# Patient Record
Sex: Female | Born: 1965 | Race: White | Hispanic: No | Marital: Married | Smoking: Never smoker
Health system: Southern US, Community
[De-identification: ages and names within clinical notes are randomized; demographics above are authoritative.]

## PROBLEM LIST (undated history)

## (undated) DIAGNOSIS — L723 Sebaceous cyst: Secondary | ICD-10-CM

## (undated) DIAGNOSIS — K219 Gastro-esophageal reflux disease without esophagitis: Secondary | ICD-10-CM

## (undated) HISTORY — DX: Gastro-esophageal reflux disease without esophagitis: K21.9

## (undated) HISTORY — DX: Sebaceous cyst: L72.3

---

## 1996-06-28 HISTORY — PX: TUBAL LIGATION: SHX77

## 2005-06-28 HISTORY — PX: TONSILLECTOMY: SUR1361

## 2013-03-08 DIAGNOSIS — K219 Gastro-esophageal reflux disease without esophagitis: Secondary | ICD-10-CM | POA: Insufficient documentation

## 2015-05-20 ENCOUNTER — Ambulatory Visit (INDEPENDENT_AMBULATORY_CARE_PROVIDER_SITE_OTHER): Payer: BLUE CROSS/BLUE SHIELD

## 2015-05-20 ENCOUNTER — Ambulatory Visit
Admission: EM | Admit: 2015-05-20 | Discharge: 2015-05-20 | Disposition: A | Discharge status: Home / Self Care | Payer: BLUE CROSS/BLUE SHIELD | Attending: Family Medicine | Admitting: Family Medicine

## 2015-05-20 DIAGNOSIS — S93602A Unspecified sprain of left foot, initial encounter: Secondary | ICD-10-CM | POA: Diagnosis not present

## 2015-05-20 MED ORDER — MELOXICAM 15 MG PO TABS: 15.0000 mg | ORAL_TABLET | Freq: Every day | ORAL | Status: DC

## 2015-05-20 NOTE — Discharge Instructions (Signed)
Cryotherapy Cryotherapy is when you put ice on your injury. Ice helps lessen pain and puffiness (swelling) after an injury. Ice works the best when you start using it in the first 24 to 48 hours after an injury. HOME CARE  Put a dry or damp towel between the ice pack and your skin.  You may press gently on the ice pack.  Leave the ice on for no more than 10 to 20 minutes at a time.  Check your skin after 5 minutes to make sure your skin is okay.  Rest at least 20 minutes between ice pack uses.  Stop using ice when your skin loses feeling (numbness).  Do not use ice on someone who cannot tell you when it hurts. This includes small children and people with memory problems (dementia). GET HELP RIGHT AWAY IF:  You have white spots on your skin.  Your skin turns blue or pale.  Your skin feels waxy or hard.  Your puffiness gets worse. MAKE SURE YOU:   Understand these instructions.  Will watch your condition.  Will get help right away if you are not doing well or get worse.   This information is not intended to replace advice given to you by your health care provider. Make sure you discuss any questions you have with your health care provider.   Document Released: 12/01/2007 Document Revised: 09/06/2011 Document Reviewed: 02/04/2011 Elsevier Interactive Patient Education 2016 [place] Sprain A foot sprain is an injury to one of the strong bands of tissue (ligaments) that connect and support the many bones in your feet. The ligament can be stretched too much or it can tear. A tear can be either partial or complete. The severity of the sprain depends on how much of the ligament was damaged or torn. CAUSES A foot sprain is usually caused by suddenly twisting or pivoting your foot. RISK FACTORS This injury is more likely to occur in people who:  Play a sport, such as basketball or football.  Exercise or play a sport without warming up.  Start a new workout or  sport.  Suddenly increase how long or hard they exercise or play a sport. SYMPTOMS Symptoms of this condition start soon after an injury and include:  Pain, especially in the arch of the foot.  Bruising.  Swelling.  Inability to walk or use the foot to support body weight. DIAGNOSIS This condition is diagnosed with a medical history and physical exam. You may also have imaging tests, such as:  X-rays to make sure there are no broken bones (fractures).  MRI to see if the ligament has torn. TREATMENT Treatment varies depending on the severity of your sprain. Mild sprains can be treated with rest, ice, compression, and elevation (RICE). If your ligament is overstretched or partially torn, treatment usually involves keeping your foot in a fixed position (immobilization) for a period of time. To help you do this, your health care provider will apply a bandage, splint, or walking boot to keep your foot from moving until it heals. You may also be advised to use crutches or a scooter for a few weeks to avoid bearing weight on your foot while it is healing. If your ligament is fully torn, you may need surgery to reconnect the ligament to the bone. After surgery, a cast or splint will be applied and will need to stay on your foot while it heals. Your health care provider may also suggest exercises or physical therapy to strengthen  your foot. HOME CARE INSTRUCTIONS If You Have a Bandage, Splint, or Walking Boot:  Wear it as directed by your health care provider. Remove it only as directed by your health care provider.  Loosen the bandage, splint, or walking boot if your toes become numb and tingle, or if they turn cold and blue. Bathing  If your health care provider approves bathing and showering, cover the bandage or splint with a watertight plastic bag to protect it from water. Do not let the bandage or splint get wet. Managing Pain, Stiffness, and Swelling   If directed, apply ice to the  injured area:  Put ice in a plastic bag.  Place a towel between your skin and the bag.  Leave the ice on for 20 minutes, 2-3 times per day.  Move your toes often to avoid stiffness and to lessen swelling.  Raise (elevate) the injured area above the level of your heart while you are sitting or lying down. Driving  Do not drive or operate heavy machinery while taking pain medicine.  Do not drive while wearing a bandage, splint, or walking boot on a foot that you use for driving. Activity  Rest as directed by your health care provider.  Do not use the injured foot to support your body weight until your health care provider says that you can. Use crutches or other supportive devices as directed by your health care provider.  Ask your health care provider what activities are safe for you. Gradually increase how much and how far you walk until your health care provider says it is safe to return to full activity.  Do any exercise or physical therapy as directed by your health care provider. General Instructions  If a splint was applied, do not put pressure on any part of it until it is fully hardened. This may take several hours.  Take medicines only as directed by your health care provider. These include over-the-counter medicines and prescription medicines.  Keep all follow-up visits as directed by your health care provider. This is important.  When you can walk without pain, wear supportive shoes that have stiff soles. Do not wear flip-flops, and do not walk barefoot. SEEK MEDICAL CARE IF:  Your pain is not controlled with medicine.  Your bruising or swelling gets worse or does not get better with treatment.  Your splint or walking boot is damaged. SEEK IMMEDIATE MEDICAL CARE IF:  Your foot is numb or blue.  Your foot feels colder than normal.   This information is not intended to replace advice given to you by your health care provider. Make sure you discuss any questions  you have with your health care provider.   Document Released: 12/04/2001 Document Revised: 10/29/2014 Document Reviewed: 04/17/2014 Elsevier Interactive Patient Education [employer] Elsevier Inc[employer]

## 2015-05-20 NOTE — ED Provider Notes (Signed)
CSN: [id number]     Arrival date & time 05/20/15  1018 History   First MD Initiated Contact with Patient 05/20/15 1051    Nurses notes were reviewed. Chief Complaint  Patient presents with  . 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 That Better initially but by last night she started having more pain in the left foot over the instep of the left foot. No history of injury in that area before. (Consider location/radiation/quality/duration/timing/severity/associated sxs/prior Treatment) Patient is a 49 y.o. female presenting with lower extremity pain. The history is provided by the patient. No language interpreter was used.  Foot Pain This is a new problem. The current episode started yesterday. The problem occurs constantly. The problem has been gradually worsening. Pertinent negatives include no chest pain, no abdominal pain, no headaches and no shortness of breath. The symptoms are aggravated by walking. Nothing relieves the symptoms. She has tried nothing for the symptoms. The treatment provided no relief.    History reviewed. No pertinent past medical history. Past Surgical History  Procedure Laterality Date  . Cesarean section      1992 1998   Family History  Problem Relation Age of Onset  . Diabetes Mother   . Cancer Father    Social History  Substance Use Topics  . Smoking status: Never Smoker   . Smokeless tobacco: None  . Alcohol Use: Yes     Comment: socially   OB History    No data available     Review of Systems  Respiratory: Negative for shortness of breath.   Cardiovascular: Negative for chest pain.  Gastrointestinal: Negative for abdominal pain.  Musculoskeletal: Positive for myalgias, arthralgias and gait problem.  Neurological: Negative for headaches.  All other systems reviewed and are  negative.   Allergies  Review of patient's allergies indicates no known allergies.  Home Medications   Prior to Admission medications   Medication Sig Start Date End Date Taking? Authorizing Provider  meloxicam (MOBIC) 15 MG tablet Take 1 tablet (15 mg total) by mouth daily. 05/20/15   Hassan RowanEugene Consetta Cosner, MD   Meds Ordered and Administered this Visit  Medications - No data to display  BP 112/77 mmHg  Pulse 87  Temp(Src) 98.8 F (37.1 C) (Tympanic)  Resp 16  Ht 5\' 6"  (1.676 m)  Wt 150 lb (68.04 kg)  BMI 24.22 kg/m2  SpO2 98%  LMP 04/14/2015 No data found.   Physical Exam  Constitutional: She is oriented to person, place, and time. She appears well-developed and well-nourished.  HENT:  Head: Normocephalic and atraumatic.  Eyes: Conjunctivae are normal. Pupils are equal, round, and reactive to light.  Musculoskeletal: She exhibits tenderness.       Left foot: There is tenderness, bony tenderness and swelling. There is normal range of motion.       Feet:  Neurological: She is alert and oriented to person, place, and time.  Skin: Skin is warm. There is erythema.  Psychiatric: She has a normal mood and affect.  Vitals reviewed.  Should be noted that most the pain and tenderness is over the between the tarsal and metatarsal bones and joint about the second and third metatarsal area. Patient's good range of motion of foot pulses are intact as well. ED Course  Procedures (including critical care time)  Labs Review Labs Reviewed - No data to display  Imaging Review Dg Foot Complete Left  05/20/2015  CLINICAL DATA:  Turned foot inward yesterday with pain over the dorsum EXAM: LEFT FOOT - COMPLETE 3+ VIEW COMPARISON:  None. FINDINGS: Tarsal-metatarsal alignment is normal. Joint spaces appear normal. No fracture is seen. IMPRESSION: Negative. Electronically Signed   By: Dwyane Dee M.D.   On: 05/20/2015 11:26     Visual Acuity Review  Right Eye Distance:   Left Eye Distance:     Bilateral Distance:    Right Eye Near:   Left Eye Near:    Bilateral Near:         MDM   1. Foot sprain, left, initial encounter    We'll place patient on Mobic 15 mg 1 tablet day she states that she is off aggressive of present holiday since did not need a work note. We talked that the place where she is painful between the metatarsal tarsal bone is be difficult to provide a type of support or brace. Both times it is like Monday to be doing a lot better. If not returned follow-up either here or PCP in about 2 weeks for reevaluation. Patient was notified x-ray was negative.      Hassan Rowan, MD 05/20/15 1154

## 2015-05-20 NOTE — ED Notes (Signed)
States rolled left foot yesterday. C/o ?2nd metatarsal pain when weight bearing

## 2015-08-06 ENCOUNTER — Encounter: Payer: Self-pay | Admitting: Family Medicine

## 2015-08-06 ENCOUNTER — Ambulatory Visit (INDEPENDENT_AMBULATORY_CARE_PROVIDER_SITE_OTHER): Payer: BLUE CROSS/BLUE SHIELD | Admitting: Family Medicine

## 2015-08-06 VITALS — BP 112/68 | HR 104 | Ht 66.0 in | Wt 150.8 lb

## 2015-08-06 DIAGNOSIS — Z1239 Encounter for other screening for malignant neoplasm of breast: Secondary | ICD-10-CM

## 2015-08-06 DIAGNOSIS — N6082 Other benign mammary dysplasias of left breast: Secondary | ICD-10-CM | POA: Diagnosis not present

## 2015-08-06 DIAGNOSIS — N6002 Solitary cyst of left breast: Secondary | ICD-10-CM | POA: Insufficient documentation

## 2015-08-06 DIAGNOSIS — Z833 Family history of diabetes mellitus: Secondary | ICD-10-CM | POA: Insufficient documentation

## 2015-08-06 DIAGNOSIS — Z818 Family history of other mental and behavioral disorders: Secondary | ICD-10-CM | POA: Diagnosis not present

## 2015-08-06 DIAGNOSIS — L723 Sebaceous cyst: Secondary | ICD-10-CM | POA: Diagnosis not present

## 2015-08-06 DIAGNOSIS — Z87440 Personal history of urinary (tract) infections: Secondary | ICD-10-CM

## 2015-08-06 DIAGNOSIS — L089 Local infection of the skin and subcutaneous tissue, unspecified: Secondary | ICD-10-CM

## 2015-08-06 MED ORDER — CEPHALEXIN 500 MG PO CAPS: 500.0000 mg | ORAL_CAPSULE | Freq: Two times a day (BID) | ORAL | Status: DC

## 2015-08-06 NOTE — Progress Notes (Signed)
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 Has infected cyst below L breast, required I+D and abxs 3-4 yrs ago but none since, now with pain, redness past 3d, does annoy chronically and would like to have removed. No regular meds, uses Naprosyn prn only. Father died prostate ca age 61, mother with DM and bipolar d/o, one sister suicide age 62 and another sister with bipolar d/o and Meniere dz. Recurrent UTI's in past but none recently. Had negative stress test in 2014. Occ GERD treated with occ antacids only. Hx frozen R shoulder and L hip tendonitis both resolved with PT. Tetanus UTD (unsure year), last pelvic and mammo 3 yrs ago, no colonoscopy. Hx anemia resolved.  Review of Systems:  Review of Systems  Constitutional: Negative for fever and fatigue.  HENT: Negative for ear pain and sore throat.   Eyes: Negative for pain.  Respiratory: Negative for shortness of breath.   Cardiovascular: Negative for chest pain and leg swelling.  Gastrointestinal: Negative for abdominal pain.  Endocrine: Negative for polyuria.  Genitourinary: Negative for difficulty urinating.  Neurological: Negative for syncope and light-headedness.    Patient Active Problem List   Diagnosis Date Noted  . Cyst of breast, left, solitary 08/06/2015  . Infected sebaceous cyst 08/06/2015  . FH: bipolar disorder 08/06/2015  . Acid reflux 03/08/2013    Prior to Admission medications   Medication Sig Start Date End Date Taking? Authorizing Provider  cephALEXin (KEFLEX) 500 MG capsule Take 1 capsule (500 mg total) by mouth 2 (two) times daily. 08/06/15   Schuyler Amor, MD    No Known Allergies  Past Surgical History  Procedure Laterality Date  . Cesarean section  [id number], 1998  . Tonsillectomy   2007    Social History  Substance Use Topics  . Smoking status: Never Smoker   . Smokeless tobacco: None  . Alcohol Use: 0.0 oz/week    0 Standard drinks or equivalent per week     Comment: socially    Family History  Problem Relation Age of Onset  . Diabetes Mother   . Cancer Father     Medication list has been reviewed and updated.  Physical Examination: BP 112/68 mmHg  Pulse 104  Ht  (1.676 m)  Wt 150 lb 12.8 oz (68.402 kg)  BMI 24.35 kg/m2  LMP 07/07/2015  Physical Exam  Constitutional: She is oriented to person, place, and time. She appears well-developed and well-nourished.  HENT:  Right Ear: External ear normal.  Left Ear: External ear normal.  Nose: Nose normal.  Mouth/Throat: Oropharynx is clear and moist.  TM's clear  Eyes: Conjunctivae and EOM are normal. Pupils are equal, round, and reactive to light. No scleral icterus.  Neck: Neck supple. No thyromegaly present.  Cardiovascular: Normal rate, regular rhythm and normal heart sounds.   Pulmonary/Chest: Effort normal and breath sounds normal.  Abdominal: Soft. She exhibits no distension and no mass. There is no tenderness.  Musculoskeletal: She exhibits no edema.  Lymphadenopathy:    She has no cervical adenopathy.  Neurological: She is alert and oriented to person, place, and time.  Skin: Skin is warm and dry. She is not diaphoretic.  2.5 x 1 cm cyst inferior to L  breast with erythema, warmth, tenderness, and induration; overlying skin thin  Psychiatric: She has a normal mood and affect. Her behavior is normal.  Nursing note and vitals reviewed.   Assessment and Plan:  1. Infected sebaceous cyst Keflex x 10d, warm compresses, call if no spontaneous expression - Comprehensive Metabolic Panel (CMET) - CBC - TSH - Lipid Profile - Vitamin D (25 hydroxy) - Ambulatory referral to General Surgery  2. Cyst, breast, sebaceous, left Refer GS for removal given chronic and recurrent sxs  3. Hx of  recurrent urinary tract infection  4. FH: bipolar disorder  5. FH: diabetes  6. Breast cancer screening Advised f/u with local GYN for Pap/pelvic/mammo  Return in about 4 weeks (around 09/03/2015).  Dionne Ano. Kingsley Spittle MD [facility]  08/06/2015

## 2015-08-07 LAB — LIPID PANEL
CHOLESTEROL TOTAL: 163 mg/dL (ref 100–199)
Chol/HDL Ratio: 3.2 ratio units (ref 0.0–4.4)
HDL: 51 mg/dL (ref >39)
LDL Calculated: 93 mg/dL (ref 0–99)
Triglycerides: 93 mg/dL (ref 0–149)
VLDL CHOLESTEROL CAL: 19 mg/dL (ref 5–40)

## 2015-08-07 LAB — CBC
HEMOGLOBIN: 13.4 g/dL (ref 11.1–15.9)
Hematocrit: 40.7 % (ref 34.0–46.6)
MCH: 30 pg (ref 26.6–33.0)
MCHC: 32.9 g/dL (ref 31.5–35.7)
MCV: 91 fL (ref 79–97)
Platelets: 284 10*3/uL (ref 150–379)
RBC: 4.46 x10E6/uL (ref 3.77–5.28)
RDW: 14.3 % (ref 12.3–15.4)
WBC: 11.1 10*3/uL — ABNORMAL HIGH (ref 3.4–10.8)

## 2015-08-07 LAB — COMPREHENSIVE METABOLIC PANEL
A/G RATIO: 2 (ref 1.1–2.5)
ALT: 12 IU/L (ref 0–32)
AST: 13 IU/L (ref 0–40)
Albumin: 4.5 g/dL (ref 3.5–5.5)
Alkaline Phosphatase: 57 IU/L (ref 39–117)
BUN/Creatinine Ratio: 13 (ref 9–23)
BUN: 9 mg/dL (ref 6–24)
Bilirubin Total: 0.2 mg/dL (ref 0.0–1.2)
CALCIUM: 9.4 mg/dL (ref 8.7–10.2)
CO2: 24 mmol/L (ref 18–29)
CREATININE: 0.67 mg/dL (ref 0.57–1.00)
Chloride: 101 mmol/L (ref 96–106)
GFR calc Af Amer: 119 mL/min/{1.73_m2} (ref >59)
GFR, EST NON AFRICAN AMERICAN: 104 mL/min/{1.73_m2} (ref >59)
GLUCOSE: 129 mg/dL — AB (ref 65–99)
Globulin, Total: 2.3 g/dL (ref 1.5–4.5)
POTASSIUM: 4.1 mmol/L (ref 3.5–5.2)
Sodium: 143 mmol/L (ref 134–144)
Total Protein: 6.8 g/dL (ref 6.0–8.5)

## 2015-08-07 LAB — TSH: TSH: 1.52 u[IU]/mL (ref 0.450–4.500)

## 2015-08-07 LAB — VITAMIN D 25 HYDROXY (VIT D DEFICIENCY, FRACTURES): VIT D 25 HYDROXY: 21 ng/mL — AB (ref 30.0–100.0)

## 2015-08-08 ENCOUNTER — Encounter: Payer: Self-pay | Admitting: Surgery

## 2015-08-08 ENCOUNTER — Other Ambulatory Visit: Payer: Self-pay | Admitting: Family Medicine

## 2015-08-08 ENCOUNTER — Ambulatory Visit (INDEPENDENT_AMBULATORY_CARE_PROVIDER_SITE_OTHER): Payer: BLUE CROSS/BLUE SHIELD | Admitting: Surgery

## 2015-08-08 ENCOUNTER — Encounter: Payer: Self-pay | Admitting: Family Medicine

## 2015-08-08 VITALS — BP 136/91 | HR 99 | Temp 98.3°F | Ht 66.0 in | Wt 151.0 lb

## 2015-08-08 DIAGNOSIS — E559 Vitamin D deficiency, unspecified: Secondary | ICD-10-CM | POA: Insufficient documentation

## 2015-08-08 DIAGNOSIS — L723 Sebaceous cyst: Secondary | ICD-10-CM

## 2015-08-08 DIAGNOSIS — L089 Local infection of the skin and subcutaneous tissue, unspecified: Secondary | ICD-10-CM

## 2015-08-08 MED ORDER — VITAMIN D 50 MCG (2000 UT) PO CAPS: 1.0000 | ORAL_CAPSULE | Freq: Every day | ORAL | Status: AC

## 2015-08-08 NOTE — Progress Notes (Signed)
Added an A1C on Friday 08/08/15

## 2015-08-08 NOTE — Progress Notes (Signed)
Subjective:     Patient ID: Beverly Andersen, female   DOB: 17-Sep-1965, 50 y.o.   MRN: [id number]  HPI  50 year old female with a left chest infected sebaceous cyst. Patient states that she's had this for at least 20 years. Patient states a few years ago it did get inflamed and was aspirated and started draining. Patient states that about a week ago it became red and started hurting as well. Patient was seen by her primary care physician tarted on Keflex. The area has been draining on its own and is less red according to the patient. Patient denies any intense pain but says there is a dull pain in the area. She denies having in contact with MRSA and has not had any other cyst like this. She denies fever, chills, nausea, vomiting, abdominal pain, constipation, diarrhea, dysuria, or hematuria.   Past Medical History  Diagnosis Date  . GERD (gastroesophageal reflux disease)   . Sebaceous cyst     Under Left breast   Past Surgical History  Procedure Laterality Date  . Tonsillectomy  2007  . Cesarean section  [id number], 1998  . Tubal ligation  1998   Family History  Problem Relation Age of Onset  . Diabetes Mother   . Cancer Father    Social History   Social History  . Marital Status: Married    Spouse Name: N/A  . Number of Children: N/A  . Years of Education: N/A   Social History Main Topics  . Smoking status: Never Smoker   . Smokeless tobacco: Never Used  . Alcohol Use: 0.0 oz/week    0 Standard drinks or equivalent per week     Comment: 2 glasses wine/ weekly  . Drug Use: No  . Sexual Activity: Not Asked   Other Topics Concern  . None   Social History Narrative    Current outpatient prescriptions:  .  cephALEXin (KEFLEX) 500 MG capsule, Take 1 capsule (500 mg total) by mouth 2 (two) times daily., Disp: 20 capsule, Rfl: 0 No Known Allergies    Review of Systems  Constitutional: Negative for fever, chills, activity change, appetite change and fatigue.  HENT:  Negative for congestion and sore throat.   Respiratory: Negative for cough, wheezing and stridor.   Cardiovascular: Negative for chest pain, palpitations and leg swelling.  Gastrointestinal: Negative for abdominal pain, diarrhea, constipation and abdominal distention.  Genitourinary: Negative for dysuria and hematuria.  Musculoskeletal: Negative for back pain.  Skin: Positive for color change and wound. Negative for pallor and rash.  Neurological: Negative for dizziness and weakness.  Hematological: Negative for adenopathy. Does not bruise/bleed easily.  Psychiatric/Behavioral: Negative for agitation. The patient is not nervous/anxious.   All other systems reviewed and are negative.      Objective:   Physical Exam  Constitutional: She is oriented to person, place, and time. She appears well-developed and well-nourished. No distress.  HENT:  Head: Normocephalic and atraumatic.  Nose: Nose normal.  Mouth/Throat: Oropharynx is clear and moist. No oropharyngeal exudate.  Eyes: Conjunctivae and EOM are normal. Pupils are equal, round, and reactive to light. No scleral icterus.  Neck: Normal range of motion. Neck supple. No tracheal deviation present.  Cardiovascular: Normal rate, regular rhythm, normal heart sounds and intact distal pulses.  Exam reveals no gallop and no friction rub.   No murmur heard. Pulmonary/Chest: Effort normal and breath sounds normal. No respiratory distress. She has no wheezes.  Abdominal: Soft. Bowel sounds are normal. She exhibits no distension.  There is no tenderness.  Musculoskeletal: Normal range of motion. She exhibits no edema or tenderness.  Neurological: She is alert and oriented to person, place, and time. No cranial nerve deficit.  Skin: Skin is warm. There is erythema.  Left inframammary fold 2cm sebaceous cyst draining purulent material with erythema surrounding.  A fair amount of purulent material was expressed with gentle pressure    Psychiatric: She  has a normal mood and affect. Her behavior is normal. Judgment and thought content normal.  Vitals reviewed.      Assessment:     Left inframammary fold sebaceous cyst    Plan:     Discussed with the patient is easier to remove the entire capsule of these when the area is not infected. Given that there is an opening in its draining, have her use warm compresses to continue helping the area drain and to finish out her Keflex. Will have her call if she feels like this area is not getting better for refill of her antibiotic. We'll have her return in 2 weeks for excision at that time once the inflammation has resolved.  Patient was given opportunity to ask questions and have them answered. Patient is agreeable to plan.

## 2015-08-08 NOTE — Progress Notes (Signed)
Called pt- her cyst is draining- Dr @ [facility] is aware and is going to see her Monday- She just had a coke prior to her labs so she thinks that is the reason her Glucose was elevated, but I informed her that we would be letting her know the results from A1C that you added.

## 2015-08-08 NOTE — Patient Instructions (Signed)
Continue your antibiotics. If you feel like this is still inflamed after finishing this antibiotics, please let us know so that we may order a refill.  Begin hot compresses at least twice daily. This will help this area continue to drain.  We will arrange an excision of this area on 08/20/15 at the [facility] office. Please see appointment below.  If you have any questions or concerns, please call our office.

## 2015-08-09 LAB — SPECIMEN STATUS REPORT

## 2015-08-09 LAB — HGB A1C W/O EAG: HEMOGLOBIN A1C: 5.8 % — AB (ref 4.8–5.6)

## 2015-08-20 ENCOUNTER — Encounter: Payer: Self-pay | Admitting: Surgery

## 2015-08-20 ENCOUNTER — Ambulatory Visit (INDEPENDENT_AMBULATORY_CARE_PROVIDER_SITE_OTHER): Payer: BLUE CROSS/BLUE SHIELD | Admitting: Surgery

## 2015-08-20 VITALS — BP 129/78 | HR 90 | Temp 97.7°F | Wt 150.0 lb

## 2015-08-20 DIAGNOSIS — L089 Local infection of the skin and subcutaneous tissue, unspecified: Secondary | ICD-10-CM

## 2015-08-20 DIAGNOSIS — L723 Sebaceous cyst: Secondary | ICD-10-CM | POA: Diagnosis not present

## 2015-08-20 NOTE — Telephone Encounter (Signed)
error 

## 2015-08-20 NOTE — Progress Notes (Signed)
50 year old female with a left breast infected sebaceous cyst. Patient was seen a couple weeks ago and was given antibiotics to help improve the area. Patient states that she has not had any fever or chills patient states that the redness has resolved. Patient did state that she had some yellowish thick drainage that came out about a week ago but has not had any other drainage since that time.  Filed Vitals:   08/20/15 1014  BP: 129/78  Pulse: 90  Temp: 97.7 F (36.5 C)   PE:  Gen: NAD Cardio: RRR, no murmur or gallop Res: CTAB/L, no wheezes Abd: soft, nt ,nd Left breast: 2cm lesion in inferiormammory fold, no erythema or drainage Ext: 2+ pulses, no edema  A/P:   50 year old with sebaceous cyst of the left breast in the inferior mammary fold. Patient's infection has cleared up with antibiotics and filled that we could excise this lesion today. Discussed the risk benefits alternatives and possible outcomes with the patient including bleeding infection and return of the sebaceous cyst. Patient was given opportunity to ask questions and have them answered. Patient is agreeable with plan and informed consent was obtained.

## 2015-08-20 NOTE — Progress Notes (Signed)
Sebaceous Cyst Excision Procedure Note  Pre-operative Diagnosis: Sebaceous cyst 2cm in size  Post-operative Diagnosis: same  Locations: Left breast inferiormammory fold  Indications: Sebaceous cyst that had been infected in the inferior mammory fold of left breast.  Anesthesia: Lidocaine 1% with epinephrine without added sodium bicarbonate  Procedure Details  History of allergy to iodine: no  Patient informed of the risks (including bleeding and infection) and benefits of the  procedure and Written informed consent obtained.  The lesion and surrounding area was given a sterile prep using betadyne and draped in the usual sterile fashion. An incision was made over the cyst, which was dissected free of the surrounding tissue and removed.  The cyst was filled with typical sebaceous material.  The wound was closed with 3-0 Vicryl using simple interrupted stitches to close the subcutaneous tissue and 4-0 Monocryl was used in running subcuticular stitch to close the skin.  Sterile glue was applied to the area.  The specimen was not sent for pathologic examination. The patient tolerated the procedure well.  EBL: 5 ml  Findings: Sebaceous material in cyst excised from skin  Condition: Stable  Complications: none.  Plan: 1. Instructed to keep the wound dry and covered for 24hrs and then could shower afterward 2. Warning signs of infection were reviewed.   3. Recommended that the patient use Naproxen, NSAID, OTC acetaminophen and OTC ibuprofen as needed for pain.  4. Return for wound check in 2 weeks.

## 2015-08-20 NOTE — Patient Instructions (Signed)
In about two weeks your incision should be healed. Please leave the glue on your skin, do not remove. This will eventually fall off on its own.  Please give [place] a call you notice any redness, fever and/or drainage from your incision.  You are able to shower after 24 hours after. Try not to scrub the incision area.  It is okay to take Naproxen, Ibuprofen or Tylenol for the pain.  We will see you in two weeks to check on how you are doing.

## 2015-09-03 ENCOUNTER — Ambulatory Visit: Payer: BLUE CROSS/BLUE SHIELD | Admitting: Family Medicine

## 2015-09-05 ENCOUNTER — Ambulatory Visit: Payer: Self-pay | Admitting: Surgery

## 2015-09-08 ENCOUNTER — Other Ambulatory Visit: Payer: Self-pay | Admitting: Family Medicine

## 2015-09-08 ENCOUNTER — Ambulatory Visit (INDEPENDENT_AMBULATORY_CARE_PROVIDER_SITE_OTHER): Payer: BLUE CROSS/BLUE SHIELD | Admitting: Surgery

## 2015-09-08 ENCOUNTER — Encounter: Payer: Self-pay | Admitting: Surgery

## 2015-09-08 ENCOUNTER — Telehealth: Payer: Self-pay | Admitting: Family Medicine

## 2015-09-08 VITALS — BP 136/91 | HR 92 | Temp 98.1°F | Wt 150.0 lb

## 2015-09-08 DIAGNOSIS — Z09 Encounter for follow-up examination after completed treatment for conditions other than malignant neoplasm: Secondary | ICD-10-CM

## 2015-09-08 DIAGNOSIS — Z1239 Encounter for other screening for malignant neoplasm of breast: Secondary | ICD-10-CM

## 2015-09-08 NOTE — Telephone Encounter (Signed)
Inform patient mammogram ordered

## 2015-09-08 NOTE — Progress Notes (Signed)
Catheter is following up after excision of a left epidermal inclusion cyst on the inframammary faulted by Dr. Orvis BrillLoflin. No complaints today. Last mammogram was 3 years ago. Discussed with her about this importance anteriorly option of medial ordering versus going through her primary care doctor. She stated that she will go to her primary care doctor and get the screening mammogram  Physical exam: No acute distress incision healing well no evidence of infection I was able to pull small Vicryl knot without any issues.  A/P doing well once again reiterated about the importance of screening mammogram. No complications. RTC when necessary.

## 2015-09-08 NOTE — Patient Instructions (Signed)
Please call your primary care doctor to schedule your mammogram.

## 2015-09-10 NOTE — Telephone Encounter (Signed)
Spoke with patient. Patient advised.  Will call back with any future questions or concerns. [facility]

## 2015-09-12 ENCOUNTER — Ambulatory Visit
Admission: RE | Admit: 2015-09-12 | Discharge: 2015-09-12 | Disposition: A | Discharge status: Home / Self Care | Payer: BLUE CROSS/BLUE SHIELD | Source: Ambulatory Visit | Attending: Family Medicine | Admitting: Family Medicine

## 2015-09-12 DIAGNOSIS — Z1231 Encounter for screening mammogram for malignant neoplasm of breast: Secondary | ICD-10-CM | POA: Diagnosis not present

## 2015-09-12 DIAGNOSIS — Z1239 Encounter for other screening for malignant neoplasm of breast: Secondary | ICD-10-CM

## 2015-09-24 ENCOUNTER — Other Ambulatory Visit: Payer: Self-pay | Admitting: Family Medicine

## 2015-09-24 DIAGNOSIS — R928 Other abnormal and inconclusive findings on diagnostic imaging of breast: Secondary | ICD-10-CM

## 2015-10-09 ENCOUNTER — Other Ambulatory Visit: Payer: BLUE CROSS/BLUE SHIELD

## 2015-10-09 ENCOUNTER — Ambulatory Visit: Payer: BLUE CROSS/BLUE SHIELD

## 2015-10-10 ENCOUNTER — Ambulatory Visit
Admission: RE | Admit: 2015-10-10 | Discharge: 2015-10-10 | Disposition: A | Discharge status: Home / Self Care | Payer: BLUE CROSS/BLUE SHIELD | Source: Ambulatory Visit | Attending: Family Medicine | Admitting: Family Medicine

## 2015-10-10 DIAGNOSIS — N6489 Other specified disorders of breast: Secondary | ICD-10-CM | POA: Insufficient documentation

## 2015-10-10 DIAGNOSIS — R928 Other abnormal and inconclusive findings on diagnostic imaging of breast: Secondary | ICD-10-CM | POA: Insufficient documentation

## 2016-11-29 IMAGING — CR DG FOOT COMPLETE 3+V*L*
4 series · 4 of 4 positions shown · non-contrast
Comparison: None.

CLINICAL DATA: Turned foot inward yesterday with pain over the
dorsum

EXAM:
LEFT FOOT - COMPLETE 3+ VIEW

[foot ap]
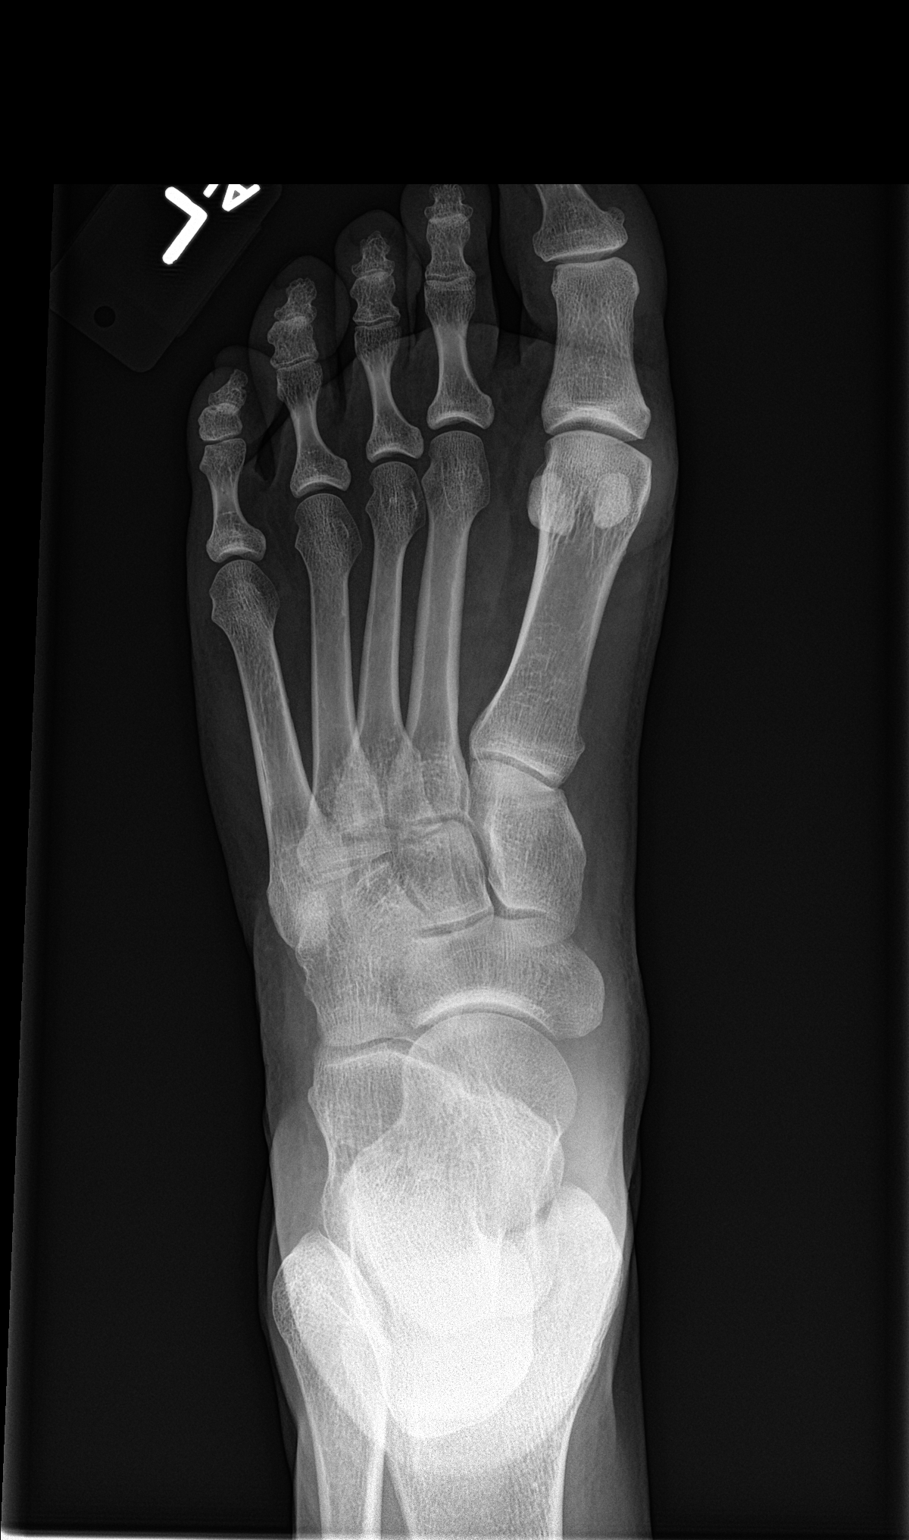

[foot obl]
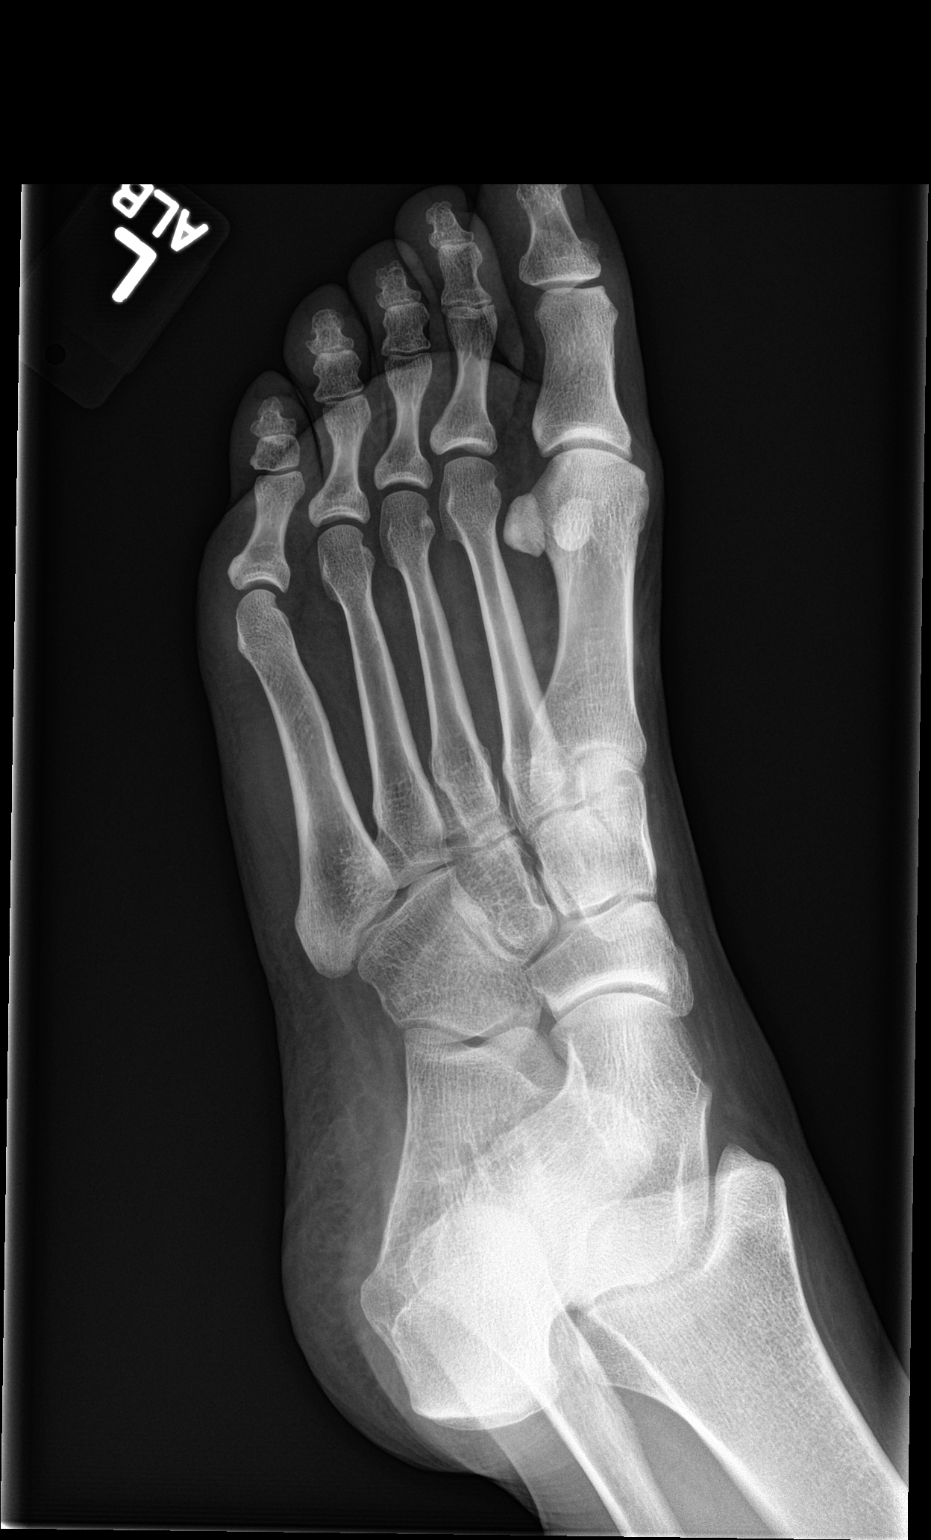

[foot lat (1 of 2)]
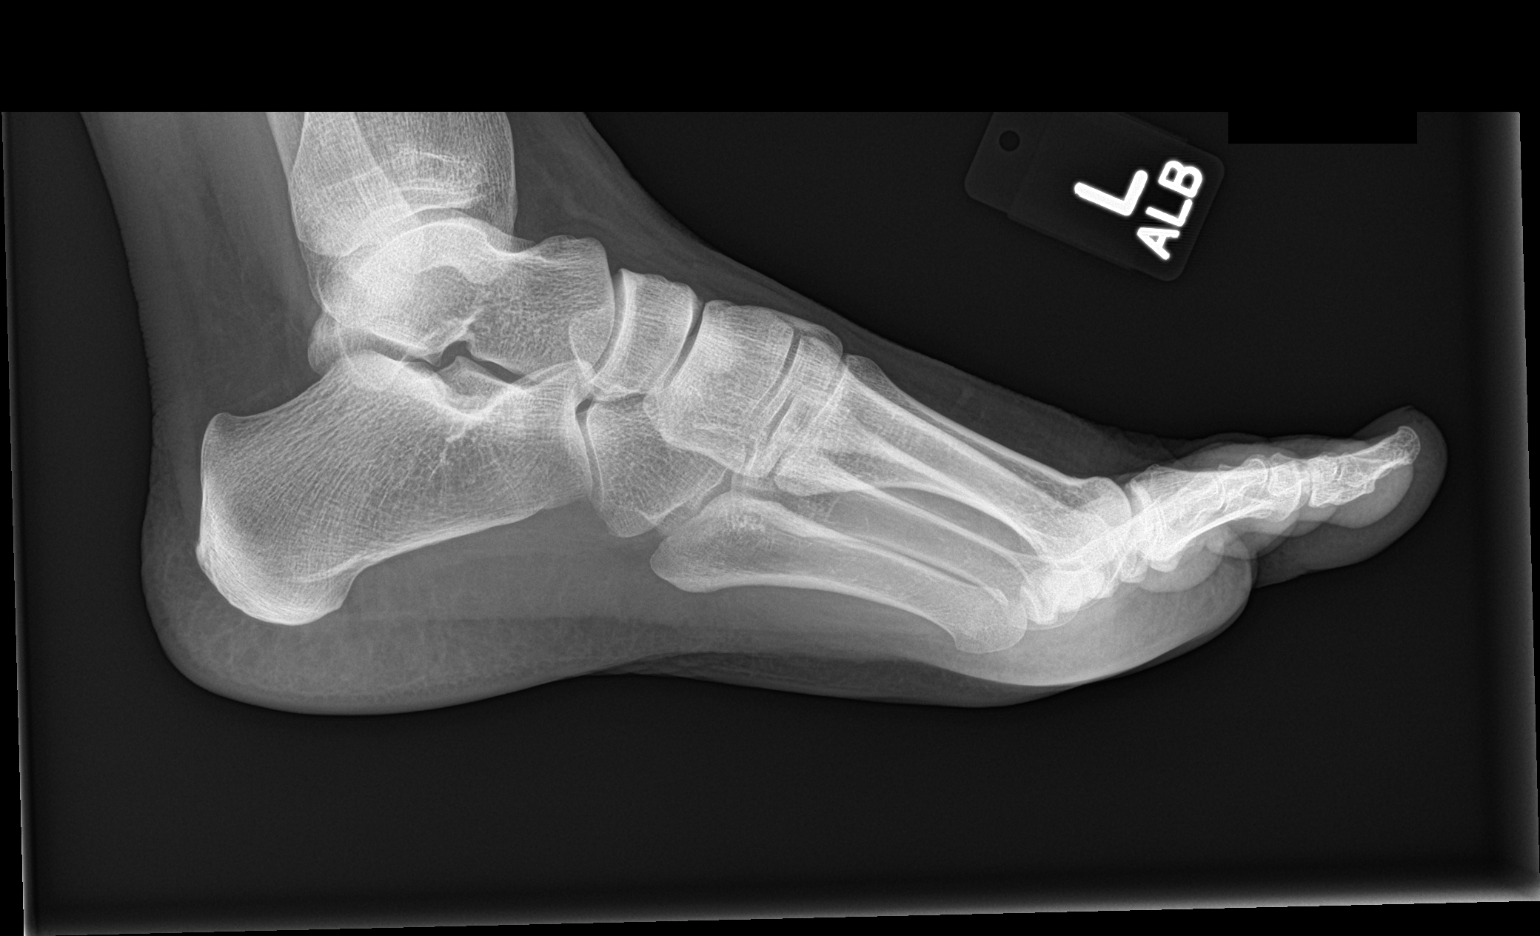

[foot lat (2 of 2)]
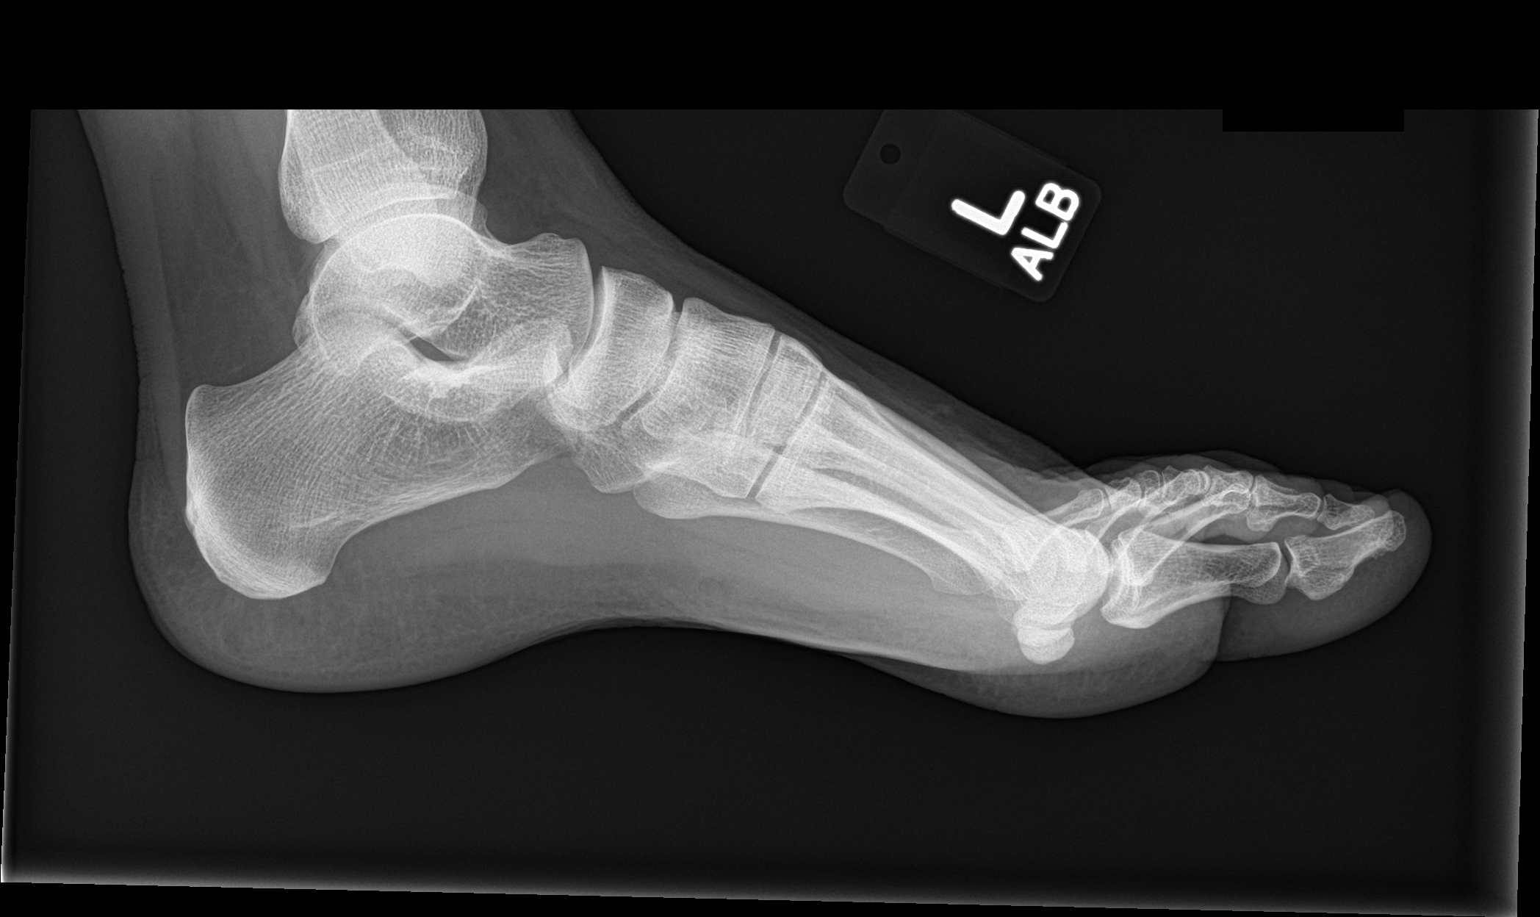

[4 of 4 positions shown; findings below may reference images not displayed]

FINDINGS: Tarsal-metatarsal alignment is normal. Joint spaces appear normal.
No fracture is seen.
IMPRESSION: Negative.

## 2016-12-27 IMAGING — US US BREAST*L* LIMITED INC AXILLA
1 series · 13 of 13 positions shown · non-contrast
Comparison: Previous exam(s).

CLINICAL DATA: 49-year-old female presenting for screening recall
of a left breast asymmetry.

EXAM:
2D DIGITAL DIAGNOSTIC UNILATERAL LEFT MAMMOGRAM WITH CAD AND ADJUNCT
TOMO
LEFT BREAST ULTRASOUND

[Series 1: us breast*left* limited inc axilla · 0.05mm/px · 13 of 13 slices shown]
[im 1/13]
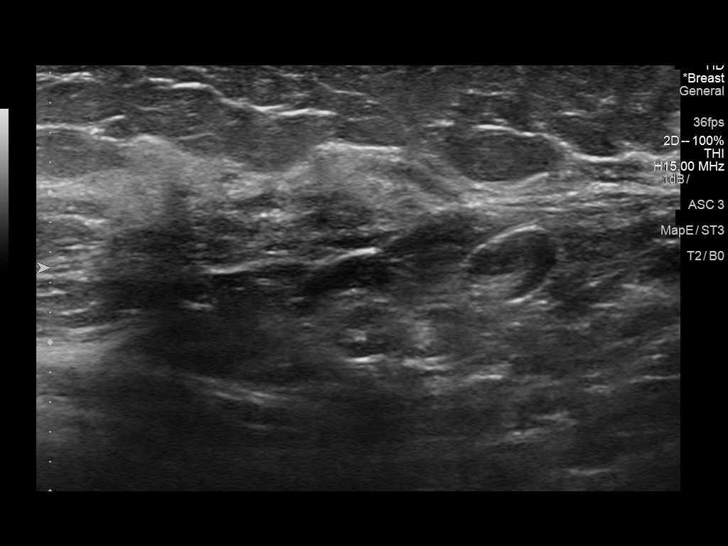
[im 2/13]
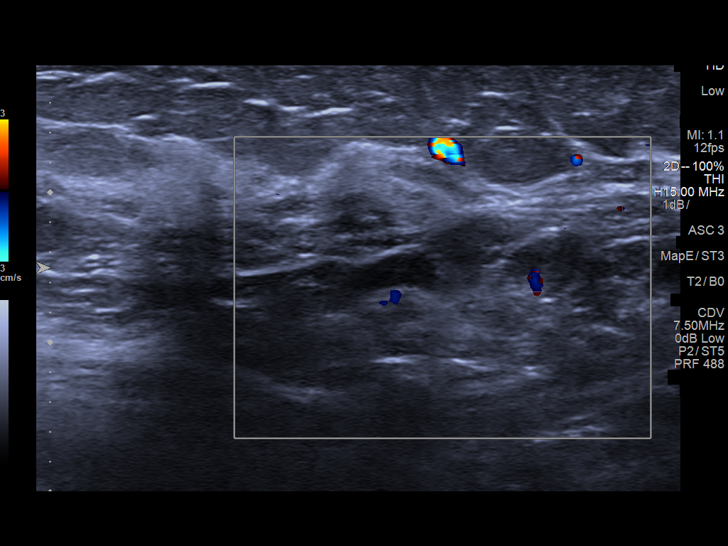
[im 3/13]
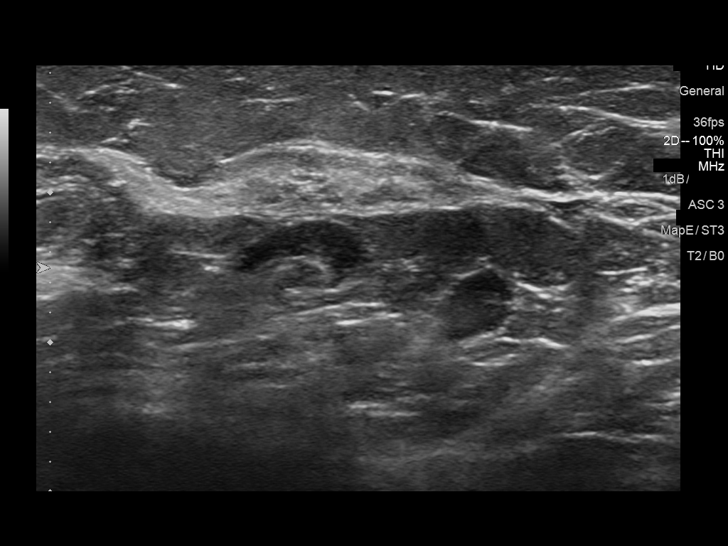
[im 4/13]
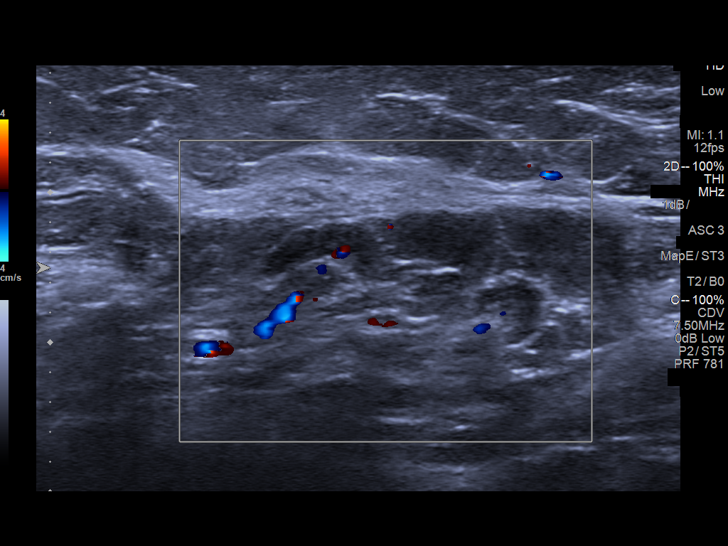
[im 5/13]
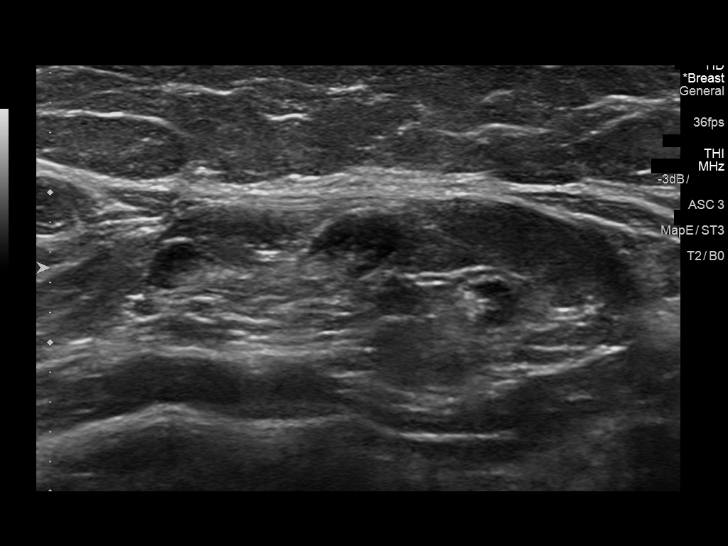
[im 6/13]
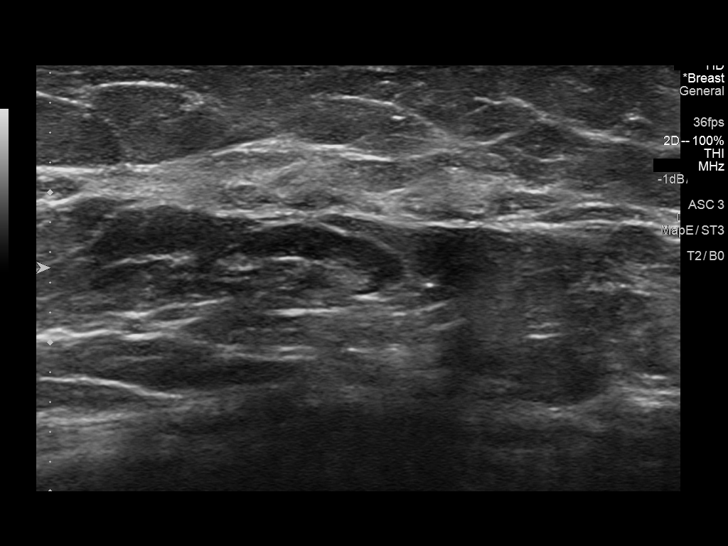
[im 7/13]
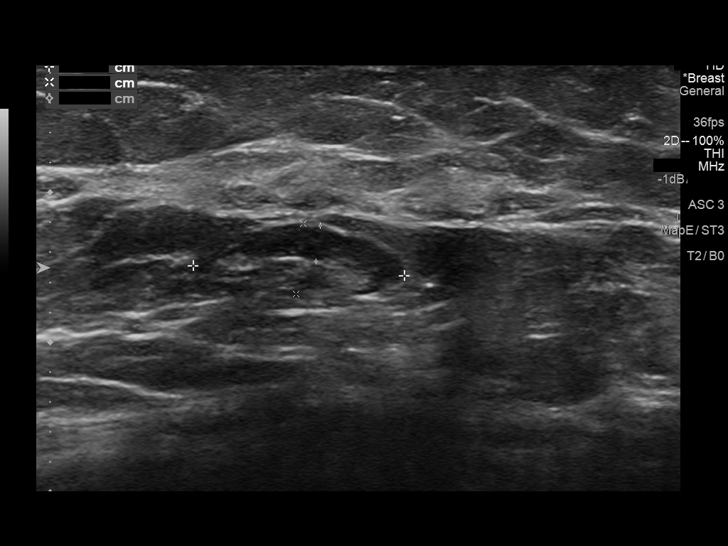
[im 8/13]
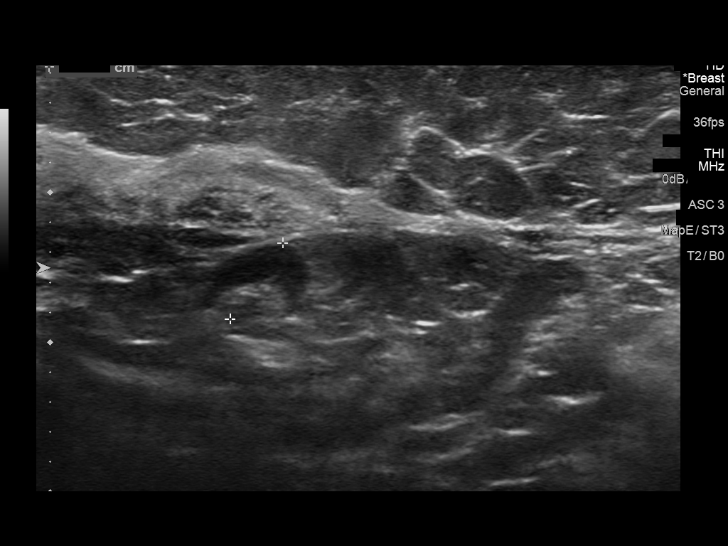
[im 9/13]
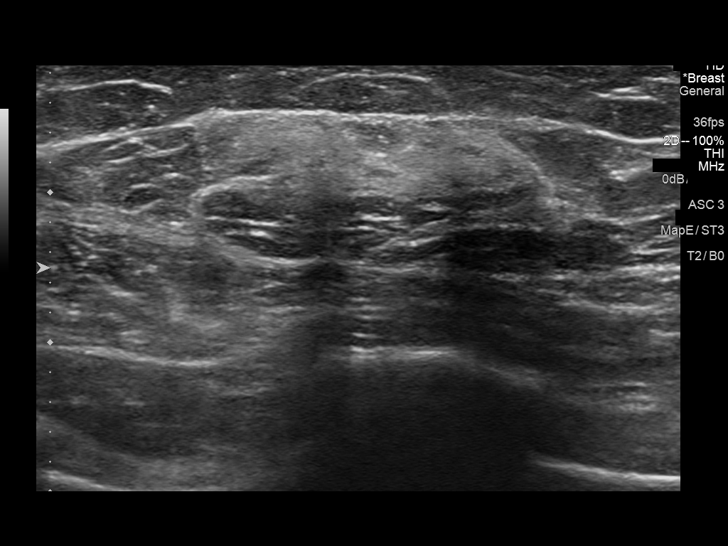
[im 10/13]
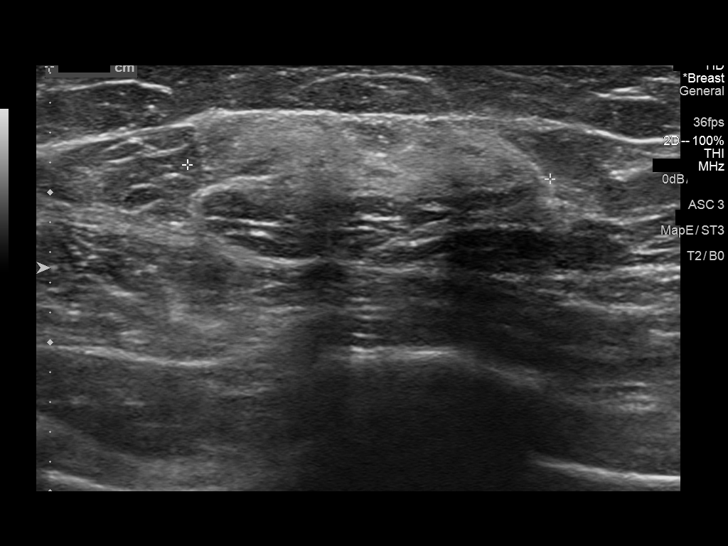
[im 11/13]
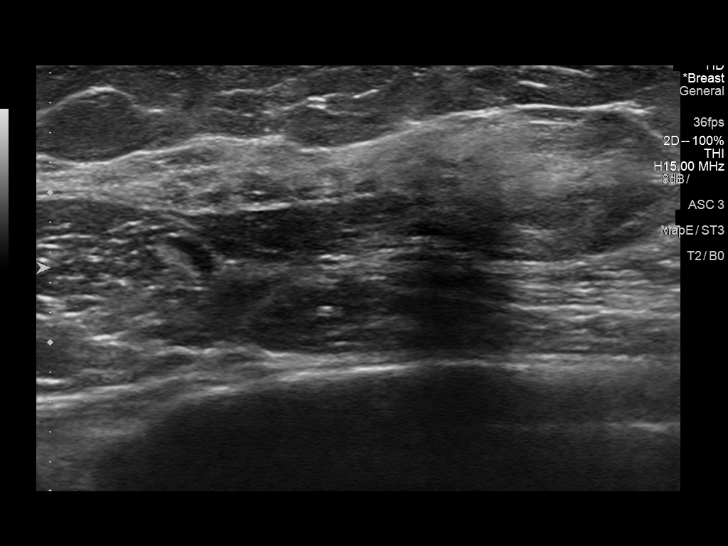
[im 12/13]
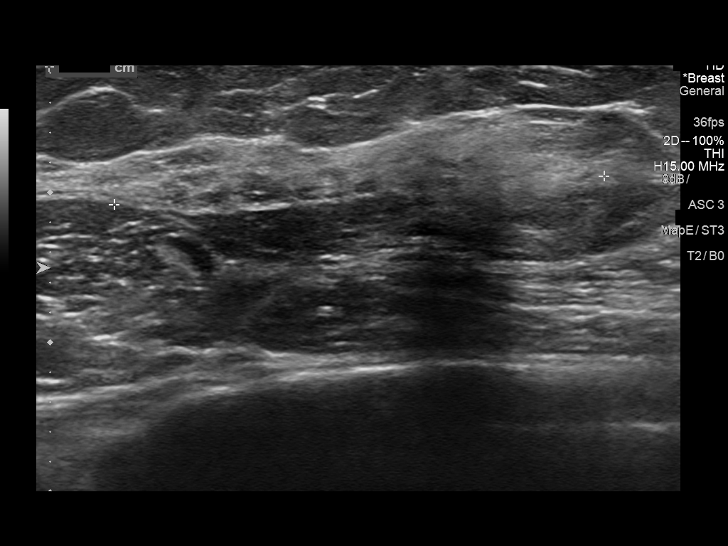
[im 13/13]
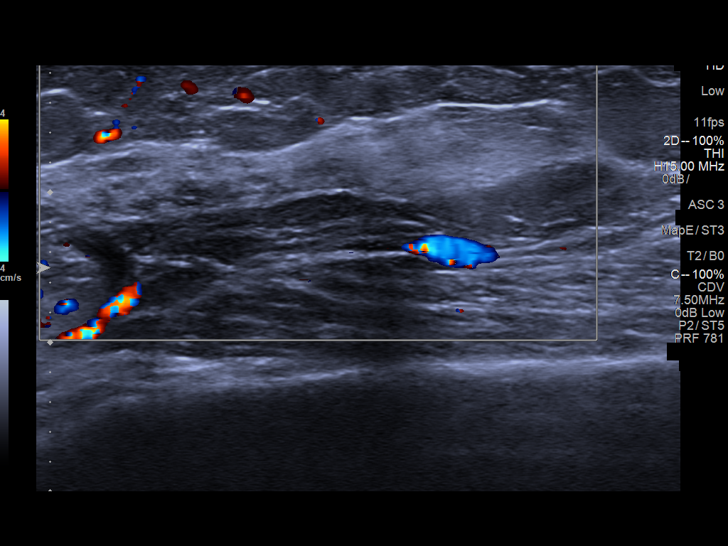

[13 of 13 positions shown; findings below may reference images not displayed]

ACR Breast Density Category c: The breast tissue is heterogeneously
dense, which may obscure small masses.
FINDINGS: On the MLO view, the additional diagnostic images of the superior
left breast demonstrate a cluster of lymph nodes, however the
prominent asymmetry seen on the screening mammogram is less
prominent. On the spot compression tomosynthesis images, the
asymmetry is not significantly changed from the 1457 exam.
Ultrasound will be performed for further evaluation, and to ensure
that the lymph nodes appear normal.

Mammographic images were processed with CAD.

Physical exam of the upper-outer left breast demonstrates a firm
ridge of tissue in the upper-outer quadrant in the axillary tail.
The patient does indicate some tenderness with palpation.

Ultrasound of the axillary tail at the approximate [DATE] position, 10
cm from the nipple demonstrates an echogenic area area which appears
to be accessory breast tissue. There is no suspicious mass or area
of shadowing. Ultrasound of the left axilla demonstrates multiple
normal-appearing lymph nodes.
IMPRESSION: 1. The focal asymmetry in the upper-outer left breast likely
represents accessory breast tissue and is probably benign.

RECOMMENDATION:
Six-month follow-up mammogram and possible ultrasound is recommended
for the focal asymmetry in the upper-outer left breast.

I have discussed the findings and recommendations with the patient.
Results were also provided in writing at the conclusion of the
visit. If applicable, a reminder letter will be sent to the patient
regarding the next appointment.

BI-RADS CATEGORY  3: Probably benign.
